# Patient Record
Sex: Female | Born: 1985 | Race: Black or African American | Hispanic: No | Smoking: Current every day smoker
Health system: Southern US, Community
[De-identification: ages and names within clinical notes are randomized; demographics above are authoritative.]

---

## 2014-07-27 ENCOUNTER — Emergency Department (HOSPITAL_COMMUNITY): Payer: BC Managed Care – PPO

## 2014-07-27 ENCOUNTER — Encounter (HOSPITAL_COMMUNITY): Payer: Self-pay | Admitting: *Deleted

## 2014-07-27 ENCOUNTER — Emergency Department (HOSPITAL_COMMUNITY)
Admission: EM | Admit: 2014-07-27 | Discharge: 2014-07-27 | Disposition: A | Discharge status: Home / Self Care | Payer: BC Managed Care – PPO | Attending: Emergency Medicine | Admitting: Emergency Medicine

## 2014-07-27 DIAGNOSIS — Z720 Tobacco use: Secondary | ICD-10-CM | POA: Diagnosis not present

## 2014-07-27 DIAGNOSIS — Z3202 Encounter for pregnancy test, result negative: Secondary | ICD-10-CM | POA: Insufficient documentation

## 2014-07-27 DIAGNOSIS — R519 Headache, unspecified: Secondary | ICD-10-CM

## 2014-07-27 DIAGNOSIS — Z79899 Other long term (current) drug therapy: Secondary | ICD-10-CM | POA: Diagnosis not present

## 2014-07-27 DIAGNOSIS — R51 Headache: Secondary | ICD-10-CM | POA: Diagnosis not present

## 2014-07-27 LAB — URINALYSIS, ROUTINE W REFLEX MICROSCOPIC
Bilirubin Urine: NEGATIVE
Glucose, UA: NEGATIVE mg/dL
Hgb urine dipstick: NEGATIVE
Ketones, ur: NEGATIVE mg/dL
NITRITE: NEGATIVE
Protein, ur: NEGATIVE mg/dL
SPECIFIC GRAVITY, URINE: 1.005 (ref 1.005–1.030)
Urobilinogen, UA: 0.2 mg/dL (ref 0.0–1.0)
pH: 6 (ref 5.0–8.0)

## 2014-07-27 LAB — BASIC METABOLIC PANEL
ANION GAP: 11 (ref 5–15)
BUN: 7 mg/dL (ref 6–23)
CO2: 25 meq/L (ref 19–32)
Calcium: 8.9 mg/dL (ref 8.4–10.5)
Chloride: 105 mEq/L (ref 96–112)
Creatinine, Ser: 0.81 mg/dL (ref 0.50–1.10)
GFR calc Af Amer: >90 mL/min (ref >90)
GFR calc non Af Amer: >90 mL/min (ref >90)
Glucose, Bld: 87 mg/dL (ref 70–99)
POTASSIUM: 4.3 meq/L (ref 3.7–5.3)
SODIUM: 141 meq/L (ref 137–147)

## 2014-07-27 LAB — CBC WITH DIFFERENTIAL/PLATELET
BASOS ABS: 0 10*3/uL (ref 0.0–0.1)
Basophils Relative: 0 % (ref 0–1)
Eosinophils Absolute: 0.1 10*3/uL (ref 0.0–0.7)
Eosinophils Relative: 1 % (ref 0–5)
HCT: 34 % — ABNORMAL LOW (ref 36.0–46.0)
HEMOGLOBIN: 11.2 g/dL — AB (ref 12.0–15.0)
Lymphocytes Relative: 45 % (ref 12–46)
Lymphs Abs: 2.5 10*3/uL (ref 0.7–4.0)
MCH: 28.5 pg (ref 26.0–34.0)
MCHC: 32.9 g/dL (ref 30.0–36.0)
MCV: 86.5 fL (ref 78.0–100.0)
MONO ABS: 0.3 10*3/uL (ref 0.1–1.0)
Monocytes Relative: 4 % (ref 3–12)
Neutro Abs: 2.8 10*3/uL (ref 1.7–7.7)
Neutrophils Relative %: 50 % (ref 43–77)
Platelets: 263 10*3/uL (ref 150–400)
RBC: 3.93 MIL/uL (ref 3.87–5.11)
RDW: 13.6 % (ref 11.5–15.5)
WBC: 5.7 10*3/uL (ref 4.0–10.5)

## 2014-07-27 LAB — URINE MICROSCOPIC-ADD ON

## 2014-07-27 LAB — PREGNANCY, URINE: Preg Test, Ur: NEGATIVE

## 2014-07-27 MED ORDER — SODIUM CHLORIDE 0.9 % IV BOLUS (SEPSIS)
1000.0000 mL | Freq: Once | INTRAVENOUS | Status: AC
  Administered 2014-07-27: 1000 mL via INTRAVENOUS

## 2014-07-27 MED ORDER — KETOROLAC TROMETHAMINE 30 MG/ML IJ SOLN
30.0000 mg | Freq: Once | INTRAMUSCULAR | Status: AC
  Administered 2014-07-27: 30 mg via INTRAVENOUS
  Filled 2014-07-27: qty 1

## 2014-07-27 MED ORDER — METOCLOPRAMIDE HCL 5 MG/ML IJ SOLN
10.0000 mg | Freq: Once | INTRAMUSCULAR | Status: AC
  Administered 2014-07-27: 10 mg via INTRAVENOUS
  Filled 2014-07-27: qty 2

## 2014-07-27 NOTE — ED Notes (Signed)
The pt is c/o a headache for one week and seeing spots  No nv or diarrhea.  lmp   This month nov 21st

## 2014-07-27 NOTE — ED Provider Notes (Signed)
CSN: [id number]     Arrival date & time 07/27/14  1729 History   First MD Initiated Contact with Patient 07/27/14 1825     Chief Complaint  Patient presents with  . Headache     (Consider location/radiation/quality/duration/timing/severity/associated sxs/prior Treatment) HPI   The patient is brought in by her mom for evaluation of headaches.  Meagan Thomas has developed headaches 1 year ago that have been progressively getting worse over the past year. Each headache is same in quality, consistency, and has been worsening in severity. Previous to the past year she never had headaches and if she did they were very mild and did not last long.  Her headaches are bifrontal and occipital. They are sharp and have pressure-like quality which she describes as behind her eyes. She has had a eye exam done and was told her vision is normal and that her headaches are not coming from her eyes. The mom has become concerned because over the past year she has had multiple transient and vague complaints. The patient did recently get divorced 1 year ago when the symptoms started. The mom is also concerned because one of [employer] colleagues was diagnosed with meningitis. She denies having any symptoms of feeling sick, neck pain, fevers, coughing, nausea, vomiting, diarrhea, abnormal periods.  History reviewed. No pertinent past medical history. History reviewed. No pertinent past surgical history. No family history on file. History  Substance Use Topics  . Smoking status: Current Every Day Smoker  . Smokeless tobacco: Not on file  . Alcohol Use: No   OB History    No data available     Review of Systems  10 Systems reviewed and are negative for acute change except as noted in the HPI.     Allergies  Peanut-containing drug products  Home Medications   Prior to Admission medications   Medication Sig Start Date End Date Taking? Authorizing Provider  pseudoephedrine (SUDAFED) 30 MG tablet Take 30 mg by  mouth every 4 (four) hours as needed for congestion.   Yes Historical Provider, MD   BP 123/80 mmHg  Pulse 82  Temp(Src) 98.5 F (36.9 C)  Resp 16  Ht 5' (1.524 m)  Wt 98 lb (44.453 kg)  BMI 19.14 kg/m2  SpO2 100%  LMP 07/21/2014 Physical Exam  Constitutional: She appears well-developed and well-nourished. No distress.  HENT:  Head: Normocephalic and atraumatic.  Right Ear: Tympanic membrane and ear canal normal.  Left Ear: Tympanic membrane and ear canal normal.  Nose: Nose normal.  Eyes: Pupils are equal, round, and reactive to light.  Neck: Normal range of motion. Neck supple. No spinous process tenderness and no muscular tenderness present. Normal range of motion present.  Cardiovascular: Normal rate and regular rhythm.   Pulmonary/Chest: Effort normal.  Abdominal: Soft. Bowel sounds are normal. She exhibits no distension and no fluid wave. There is no tenderness. There is no rigidity, no rebound, no guarding and no CVA tenderness.  Neurological: She is alert.  Cranial nerves II-VIII and X-XII evaluated and show no deficits. Pt alert and oriented x 3 Upper and lower extremity strength is symmetrical and physiologic Normal muscular tone No facial droop Coordination intact, no limb ataxia, finger-nose-finger normal Rapid alternating movements normal No pronator drift  Skin: Skin is warm and dry.  Nursing note and vitals reviewed.   ED Course  Procedures (including critical care time) Labs Review Labs Reviewed  CBC WITH DIFFERENTIAL  BASIC METABOLIC PANEL  URINALYSIS, ROUTINE W REFLEX MICROSCOPIC  PREGNANCY,  URINE    Imaging Review No results found.   EKG Interpretation None      MDM   Final diagnoses:  Headache    Medications  sodium chloride 0.9 % bolus 1,000 mL (1,000 mLs Intravenous New Bag/Given 07/27/14 1948)  metoCLOPramide (REGLAN) injection 10 mg (10 mg Intravenous Given 07/27/14 1948)  ketorolac (TORADOL) 30 MG/ML injection 30 mg (30 mg  Intravenous Given 07/27/14 1948)    The patients pain resolved from an 8/10 to 0/10 with the fluids and Toradol with Reglan. Her labs and head CT are unremarkable.  The patient denies any symptoms of neurological impairment or TIA's; no amaurosis, diplopia, dysphasia, or unilateral disturbance of motor or sensory function. No loss of balance or vertigo.  I recommend she follow-up with Neurology and start keeping a headache journal documenting everything from how much  stress, water, food,mood,  sleep and physical activity she has had.   [name] evaluation in the Emergency Department is complete. It has been determined that no acute conditions requiring further emergency intervention are present at this time. The patient/guardian have been advised of the diagnosis and plan. We have discussed signs and symptoms that warrant return to the ED, such as changes or worsening in symptoms.  Vital signs are stable at discharge. Filed Vitals:   07/27/14 2149  BP:   Pulse:   Temp: 98.6 F (37 C)  Resp:     Patient/guardian has voiced understanding and agreed to follow-up with the PCP or specialist.    Dorthula Matasiffany G Raford Brissett, PA-C 07/27/14 2232  Mirian MoMatthew Gentry, MD 07/31/14 [phone]

## 2014-07-27 NOTE — Discharge Instructions (Signed)
Headaches, Frequently Asked Questions °MIGRAINE HEADACHES °Q: What is migraine? What causes it? How can I treat it? °A: Generally, migraine headaches begin as a dull ache. Then they develop into a constant, throbbing, and pulsating pain. You may experience pain at the temples. You may experience pain at the front or back of one or both sides of the head. The pain is usually accompanied by a combination of: °· Nausea. °· Vomiting. °· Sensitivity to light and noise. °Some people (about 15%) experience an aura (see below) before an attack. The cause of migraine is believed to be chemical reactions in the brain. Treatment for migraine may include over-the-counter or prescription medications. It may also include self-help techniques. These include relaxation training and biofeedback.  °Q: What is an aura? °A: About 15% of people with migraine get an "aura". This is a sign of neurological symptoms that occur before a migraine headache. You may see wavy or jagged lines, dots, or flashing lights. You might experience tunnel vision or blind spots in one or both eyes. The aura can include visual or auditory hallucinations (something imagined). It may include disruptions in smell (such as strange odors), taste or touch. Other symptoms include: °· Numbness. °· A "pins and needles" sensation. °· Difficulty in recalling or speaking the correct word. °These neurological events may last as long as 60 minutes. These symptoms will fade as the headache begins. °Q: What is a trigger? °A: Certain physical or environmental factors can lead to or "trigger" a migraine. These include: °· Foods. °· Hormonal changes. °· Weather. °· Stress. °It is important to remember that triggers are different for everyone. To help prevent migraine attacks, you need to figure out which triggers affect you. Keep a headache diary. This is a good way to track triggers. The diary will help you talk to your healthcare professional about your condition. °Q: Does  weather affect migraines? °A: Bright sunshine, hot, humid conditions, and drastic changes in barometric pressure may lead to, or "trigger," a migraine attack in some people. But studies have shown that weather does not act as a trigger for everyone with migraines. °Q: What is the link between migraine and hormones? °A: Hormones start and regulate many of your body's functions. Hormones keep your body in balance within a constantly changing environment. The levels of hormones in your body are unbalanced at times. Examples are during menstruation, pregnancy, or menopause. That can lead to a migraine attack. In fact, about three quarters of all women with migraine report that their attacks are related to the menstrual cycle.  °Q: Is there an increased risk of stroke for migraine sufferers? °A: The likelihood of a migraine attack causing a stroke is very remote. That is not to say that migraine sufferers cannot have a stroke associated with their migraines. In persons under age 40, the most common associated factor for stroke is migraine headache. But over the course of a person's normal life span, the occurrence of migraine headache may actually be associated with a reduced risk of dying from cerebrovascular disease due to stroke.  °Q: What are acute medications for migraine? °A: Acute medications are used to treat the pain of the headache after it has started. Examples over-the-counter medications, NSAIDs, ergots, and triptans.  °Q: What are the triptans? °A: Triptans are the newest class of abortive medications. They are specifically targeted to treat migraine. Triptans are vasoconstrictors. They moderate some chemical reactions in the brain. The triptans work on receptors in your brain. Triptans help   to restore the balance of a neurotransmitter called serotonin. Fluctuations in levels of serotonin are thought to be a main cause of migraine.  °Q: Are over-the-counter medications for migraine effective? °A:  Over-the-counter, or "OTC," medications may be effective in relieving mild to moderate pain and associated symptoms of migraine. But you should see your caregiver before beginning any treatment regimen for migraine.  °Q: What are preventive medications for migraine? °A: Preventive medications for migraine are sometimes referred to as "prophylactic" treatments. They are used to reduce the frequency, severity, and length of migraine attacks. Examples of preventive medications include antiepileptic medications, antidepressants, beta-blockers, calcium channel blockers, and NSAIDs (nonsteroidal anti-inflammatory drugs). °Q: Why are anticonvulsants used to treat migraine? °A: During the past few years, there has been an increased interest in antiepileptic drugs for the prevention of migraine. They are sometimes referred to as "anticonvulsants". Both epilepsy and migraine may be caused by similar reactions in the brain.  °Q: Why are antidepressants used to treat migraine? °A: Antidepressants are typically used to treat people with depression. They may reduce migraine frequency by regulating chemical levels, such as serotonin, in the brain.  °Q: What alternative therapies are used to treat migraine? °A: The term "alternative therapies" is often used to describe treatments considered outside the scope of conventional Western medicine. Examples of alternative therapy include acupuncture, acupressure, and yoga. Another common alternative treatment is herbal therapy. Some herbs are believed to relieve headache pain. Always discuss alternative therapies with your caregiver before proceeding. Some herbal products contain arsenic and other toxins. °TENSION HEADACHES °Q: What is a tension-type headache? What causes it? How can I treat it? °A: Tension-type headaches occur randomly. They are often the result of temporary stress, anxiety, fatigue, or anger. Symptoms include soreness in your temples, a tightening band-like sensation  around your head (a "vice-like" ache). Symptoms can also include a pulling feeling, pressure sensations, and contracting head and neck muscles. The headache begins in your forehead, temples, or the back of your head and neck. Treatment for tension-type headache may include over-the-counter or prescription medications. Treatment may also include self-help techniques such as relaxation training and biofeedback. °CLUSTER HEADACHES °Q: What is a cluster headache? What causes it? How can I treat it? °A: Cluster headache gets its name because the attacks come in groups. The pain arrives with little, if any, warning. It is usually on one side of the head. A tearing or bloodshot eye and a runny nose on the same side of the headache may also accompany the pain. Cluster headaches are believed to be caused by chemical reactions in the brain. They have been described as the most severe and intense of any headache type. Treatment for cluster headache includes prescription medication and oxygen. °SINUS HEADACHES °Q: What is a sinus headache? What causes it? How can I treat it? °A: When a cavity in the bones of the face and skull (a sinus) becomes inflamed, the inflammation will cause localized pain. This condition is usually the result of an allergic reaction, a tumor, or an infection. If your headache is caused by a sinus blockage, such as an infection, you will probably have a fever. An x-ray will confirm a sinus blockage. Your caregiver's treatment might include antibiotics for the infection, as well as antihistamines or decongestants.  °REBOUND HEADACHES °Q: What is a rebound headache? What causes it? How can I treat it? °A: A pattern of taking acute headache medications too often can lead to a condition known as "rebound headache."   A pattern of taking too much headache medication includes taking it more than 2 days per week or in excessive amounts. That means more than the label or a caregiver advises. With rebound  headaches, your medications not only stop relieving pain, they actually begin to cause headaches. Doctors treat rebound headache by tapering the medication that is being overused. Sometimes your caregiver will gradually substitute a different type of treatment or medication. Stopping may be a challenge. Regularly overusing a medication increases the potential for serious side effects. Consult a caregiver if you regularly use headache medications more than 2 days per week or more than the label advises. °ADDITIONAL QUESTIONS AND ANSWERS °Q: What is biofeedback? °A: Biofeedback is a self-help treatment. Biofeedback uses special equipment to monitor your body's involuntary physical responses. Biofeedback monitors: °· Breathing. °· Pulse. °· Heart rate. °· Temperature. °· Muscle tension. °· Brain activity. °Biofeedback helps you refine and perfect your relaxation exercises. You learn to control the physical responses that are related to stress. Once the technique has been mastered, you do not need the equipment any more. °Q: Are headaches hereditary? °A: Four out of five (80%) of people that suffer report a family history of migraine. Scientists are not sure if this is genetic or a family predisposition. Despite the uncertainty, a child has a 50% chance of having migraine if one parent suffers. The child has a 75% chance if both parents suffer.  °Q: Can children get headaches? °A: By the time they reach high school, most young people have experienced some type of headache. Many safe and effective approaches or medications can prevent a headache from occurring or stop it after it has begun.  °Q: What type of doctor should I see to diagnose and treat my headache? °A: Start with your primary caregiver. Discuss his or her experience and approach to headaches. Discuss methods of classification, diagnosis, and treatment. Your caregiver may decide to recommend you to a headache specialist, depending upon your symptoms or other  physical conditions. Having diabetes, allergies, etc., may require a more comprehensive and inclusive approach to your headache. The National Head[employer] will provide, upon request, a list of NHF physician members in your state. °Document Released: 11/07/2003 Document Revised: 11/09/2011 Document Reviewed: 04/16/2008 °ExitCare® Patient Information ©2015 [employer]. This information is not intended to replace advice given to you by your health care provider. Make sure you discuss any questions you have with your health care provider. ° °General Headache Without Cause °A headache is pain or discomfort felt around the head or neck area. The specific cause of a headache may not be found. There are many causes and types of headaches. A few common ones are: °· Tension headaches. °· Migraine headaches. °· Cluster headaches. °· Chronic daily headaches. °HOME CARE INSTRUCTIONS  °· Keep all follow-up appointments with your caregiver or any specialist referral. °· Only take over-the-counter or prescription medicines for pain or discomfort as directed by your caregiver. °· Lie down in a dark, quiet room when you have a headache. °· Keep a headache journal to find out what may trigger your migraine headaches. For example, write down: °¨ What you eat and drink. °¨ How much sleep you get. °¨ Any change to your diet or medicines. °· Try massage or other relaxation techniques. °· Put ice packs or heat on the head and neck. Use these 3 to 4 times per day for 15 to 20 minutes each time, or as needed. °· Limit stress. °· Sit up   straight, and do not tense your muscles. °· Quit smoking if you smoke. °· Limit alcohol use. °· Decrease the amount of caffeine you drink, or stop drinking caffeine. °· Eat and sleep on a regular schedule. °· Get 7 to 9 hours of sleep, or as recommended by your caregiver. °· Keep lights dim if bright lights bother you and make your headaches worse. °SEEK MEDICAL CARE IF:  °· You have problems with the  medicines you were prescribed. °· Your medicines are not working. °· You have a change from the usual headache. °· You have nausea or vomiting. °SEEK IMMEDIATE MEDICAL CARE IF:  °· Your headache becomes severe. °· You have a fever. °· You have a stiff neck. °· You have loss of vision. °· You have muscular weakness or loss of muscle control. °· You start losing your balance or have trouble walking. °· You feel faint or pass out. °· You have severe symptoms that are different from your first symptoms. °MAKE SURE YOU:  °· Understand these instructions. °· Will watch your condition. °· Will get help right away if you are not doing well or get worse. °Document Released: 08/17/2005 Document Revised: 11/09/2011 Document Reviewed: 09/02/2011 °ExitCare® Patient Information ©2015 [employer]. This information is not intended to replace advice given to you by your health care provider. Make sure you discuss any questions you have with your health care provider. ° °

## 2014-07-30 LAB — URINE CULTURE: Colony Count: >=100000

## 2014-07-31 ENCOUNTER — Telehealth (HOSPITAL_BASED_OUTPATIENT_CLINIC_OR_DEPARTMENT_OTHER): Payer: Self-pay | Admitting: Emergency Medicine

## 2014-07-31 NOTE — Telephone Encounter (Signed)
Post ED Visit - Positive Culture Follow-up: Successful Patient Follow-Up  Culture assessed and recommendations reviewed by: []  Wes Dulaney, Pharm.D., BCPS [x]  Celedonio MiyamotoJeremy Frens, Pharm.D., BCPS []  Georgina PillionElizabeth Martin, Pharm.D., BCPS []  StacyvilleMinh Pham, [address] BoulevardPharm.D., BCPS, AAHIVP []  Estella HuskMichelle Turner, Pharm.D., BCPS, AAHIVP []  Red ChristiansSamson Lee, Pharm.D. []  Tennis Mustassie Stewart, Pharm.D.  Positive urine culture E. Coli  [x]  Patient discharged without antimicrobial prescription and treatment is now indicated []  Organism is resistant to prescribed ED discharge antimicrobial []  Patient with positive blood cultures  Changes discussed with ED provider: Emilia BeckKaitlyn Szekalski PA  New antibiotic prescription Metronidazole 500mg  po bid x 7 days   Attempt to call pt and notify, lvm for callback    Berle MullMiller, Alanya Vukelich 07/31/2014, 11:56 AM

## 2014-07-31 NOTE — Progress Notes (Signed)
ED Antimicrobial Stewardship Positive Culture Follow Up   Meagan Thomas is an 28 y.o. female who [facility] with a chief complaint of  Chief Complaint  Patient presents with  . Headache    Recent Results (from the past 720 hour(s))  Urine culture     Status: None   Collection Time: 07/27/14  8:42 PM  Result Value Ref Range Status   Specimen Description URINE, CLEAN CATCH  Final   Special Requests ADDED [id number]  Final   Culture  Setup Time   Final    07/28/2014 02:48 Performed at [facility]    Colony Count   Final    >=100,000 COLONIES/ML Performed at [facility]    Culture   Final    ESCHERICHIA COLI Performed at [facility]    Report Status 07/30/2014 FINAL  Final   Organism ID, Bacteria ESCHERICHIA COLI  Final      Susceptibility   Escherichia coli - MIC*    AMPICILLIN 4 SENSITIVE Sensitive     CEFAZOLIN <=4 SENSITIVE Sensitive     CEFTRIAXONE <=1 SENSITIVE Sensitive     CIPROFLOXACIN >=4 RESISTANT Resistant     GENTAMICIN <=1 SENSITIVE Sensitive     LEVOFLOXACIN >=8 RESISTANT Resistant     NITROFURANTOIN <=16 SENSITIVE Sensitive     TOBRAMYCIN <=1 SENSITIVE Sensitive     TRIMETH/SULFA <=20 SENSITIVE Sensitive     PIP/TAZO <=4 SENSITIVE Sensitive     * ESCHERICHIA COLI     [x]  Patient discharged originally without antimicrobial agent and treatment is now indicated Urine culture is contaminated with squamous cells.  UA positive for trichomonas.  New antibiotic prescription: metronidazole 500 po BID x 7 days  ED Provider: Emilia BeckKaitlyn Szekalski PA-C   Mickeal SkinnerFrens, Jaquala Fuller John 07/31/2014, 9:28 AM Infectious Diseases Pharmacist Phone# [phone]

## 2014-08-03 ENCOUNTER — Telehealth: Payer: Self-pay | Admitting: *Deleted

## 2015-07-01 IMAGING — CT CT HEAD W/O CM
2 series · 16 of 30 positions shown, 20 images · non-contrast
Comparison: None.

CLINICAL DATA: Headache

EXAM:
CT HEAD WITHOUT CONTRAST
TECHNIQUE: Contiguous axial images were obtained from the base of the skull
through the vertex without intravenous contrast.

[Series 201: head w/o, idose (1) · axial · non-contrast · 0.49mm/px · z∈[+81,+201]mm · 13 of 30 slices shown, 17 images]
[im 3/30  brain]
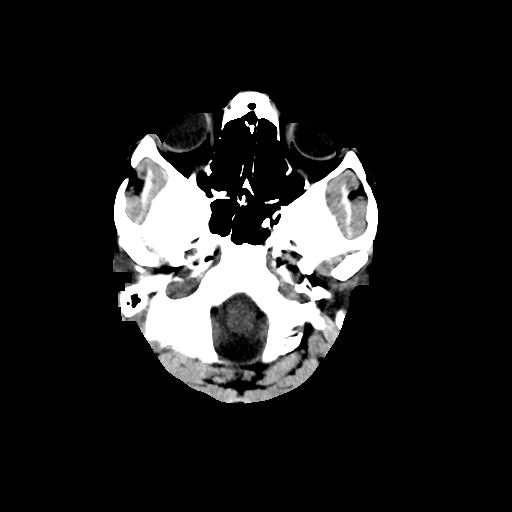
[im 3/30  bone]
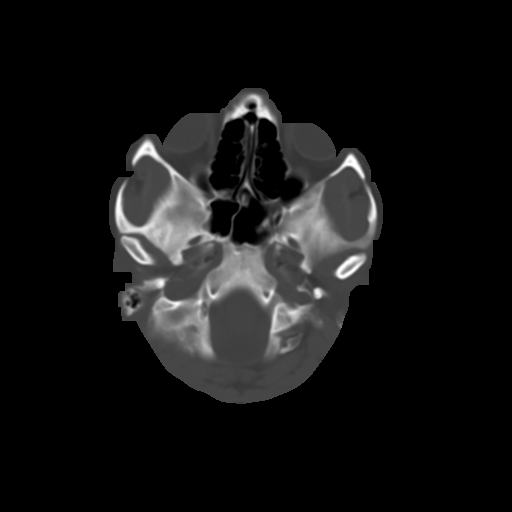
[im 5/30  brain]
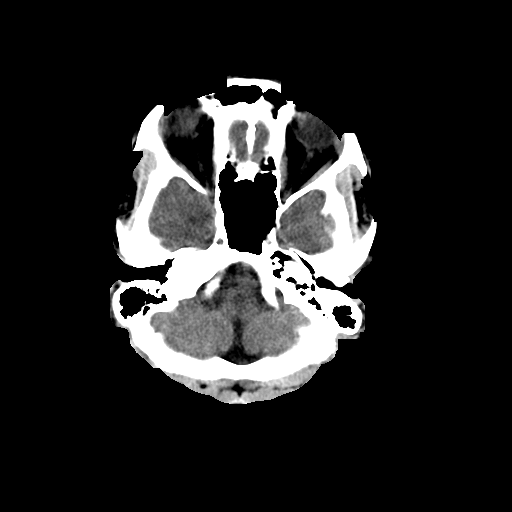
[im 7/30  brain]
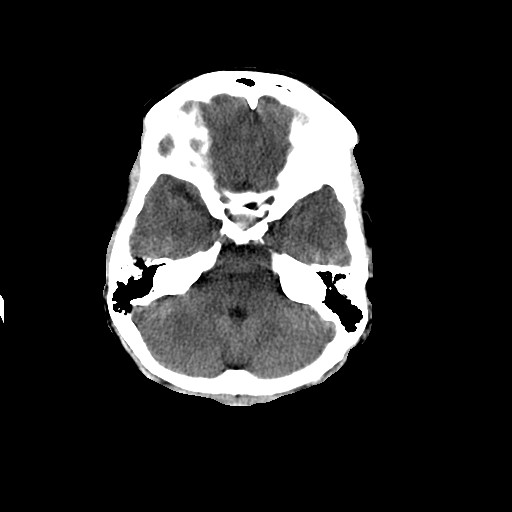
[im 9/30  brain]
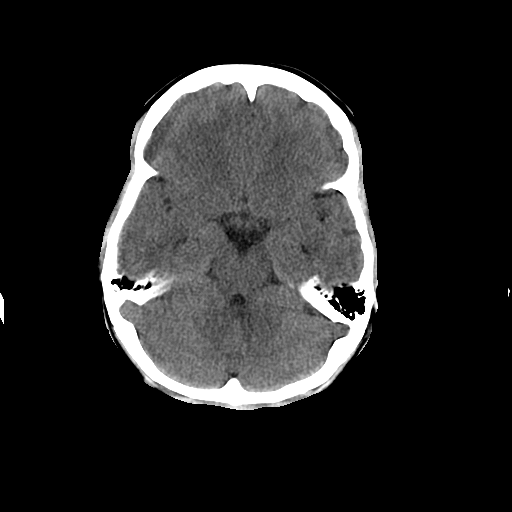
[im 11/30  brain]
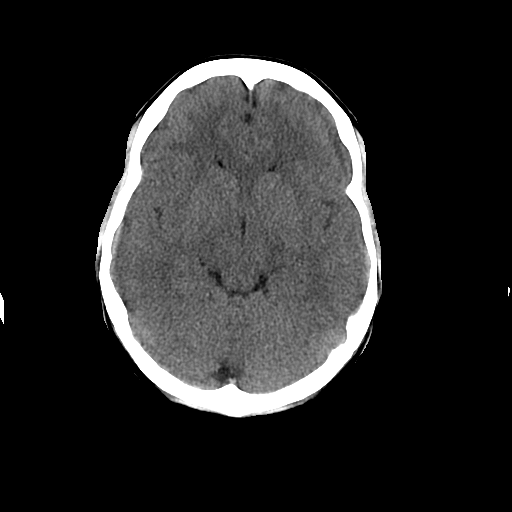
[im 11/30  bone]
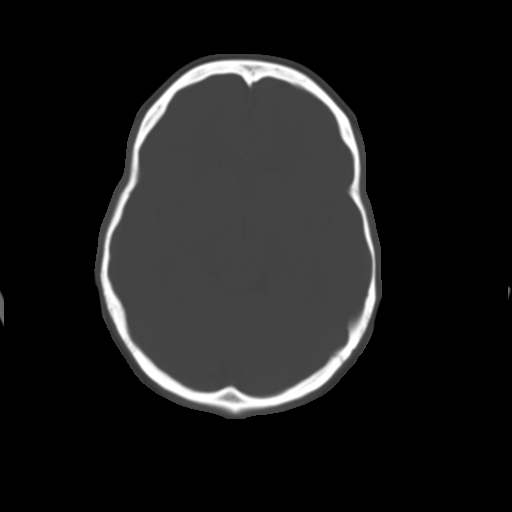
[im 13/30  brain]
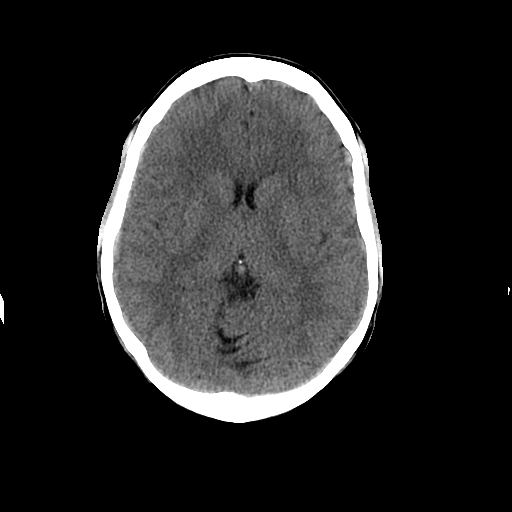
[im 15/30  brain]
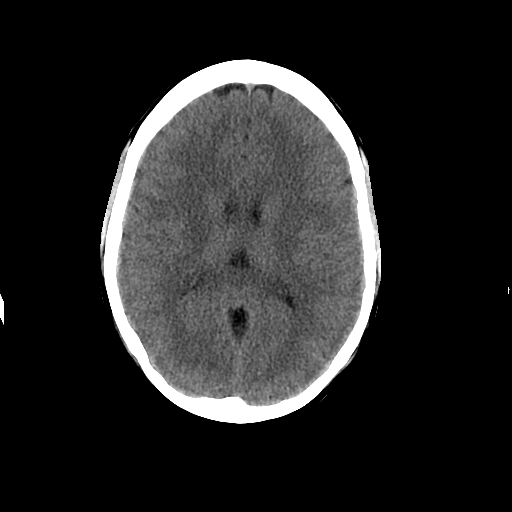
[im 17/30  brain]
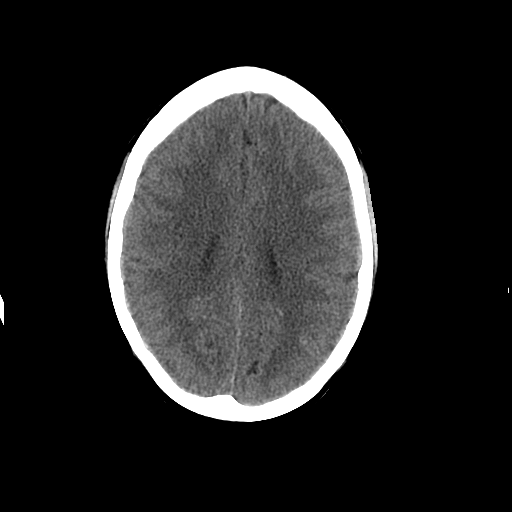
[im 19/30  brain]
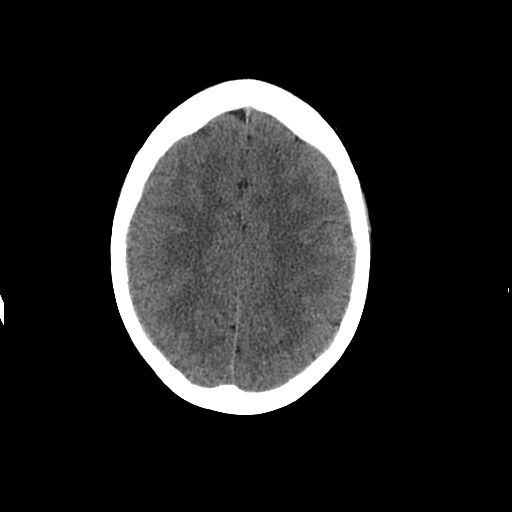
[im 19/30  bone]
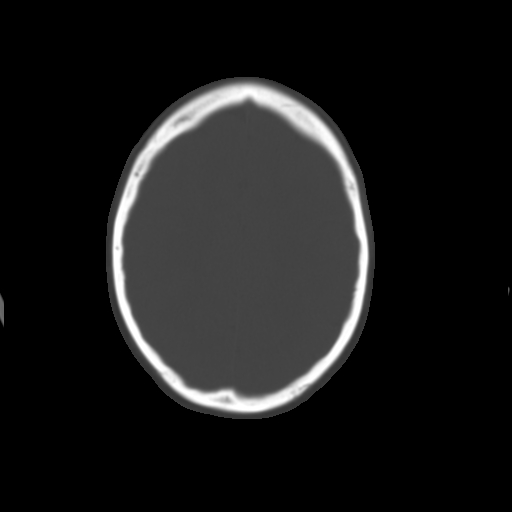
[im 21/30  brain]
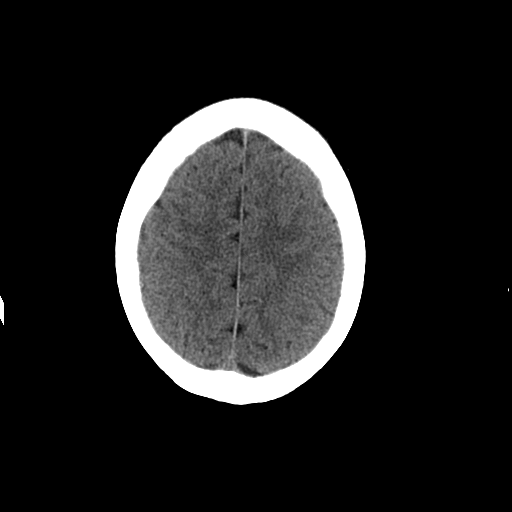
[im 23/30  brain]
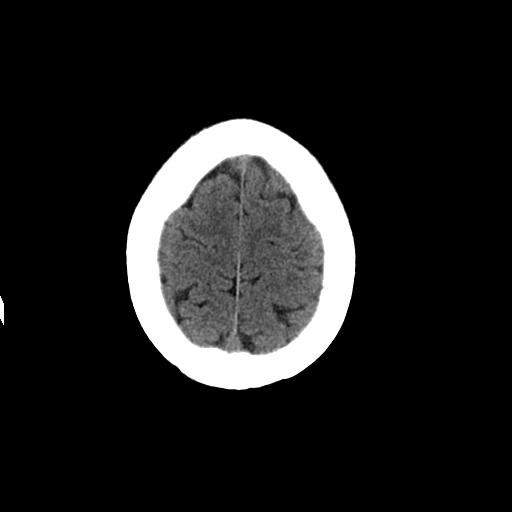
[im 25/30  brain]
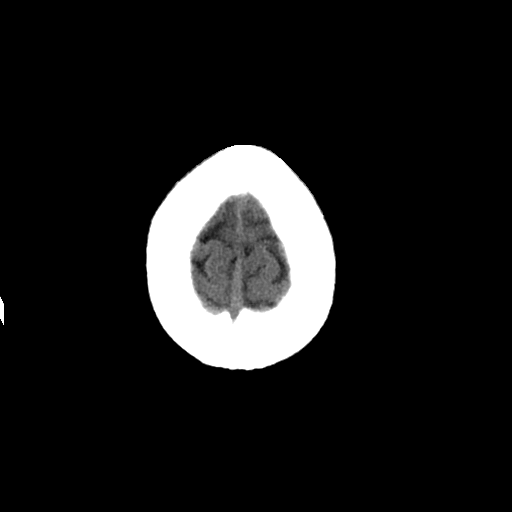
[im 27/30  brain]
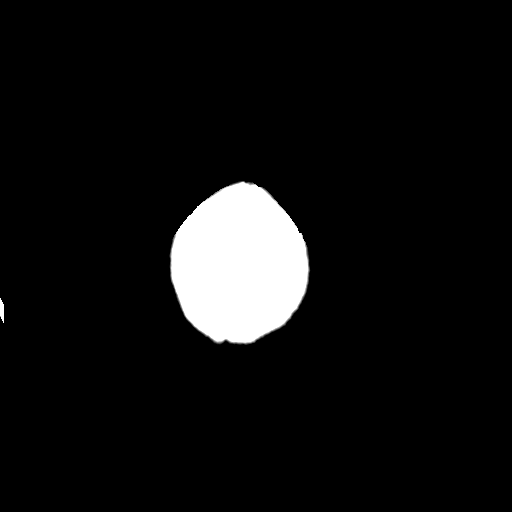
[im 27/30  bone]
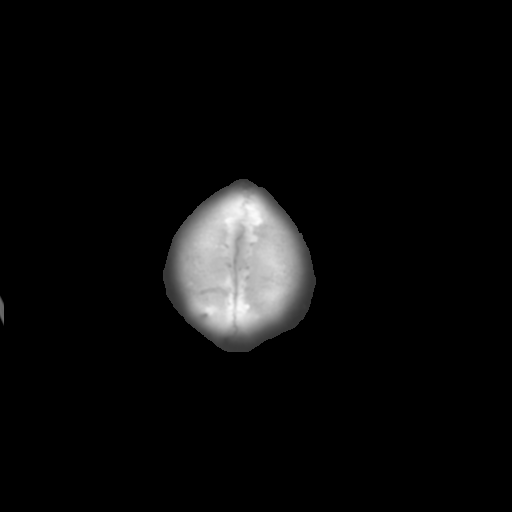

[Series 202: head w/o bone, idose (1) · axial · non-contrast · 0.49mm/px · z∈[+81,+121]mm · 3 of 30 slices shown]
[im 3/30  bone]
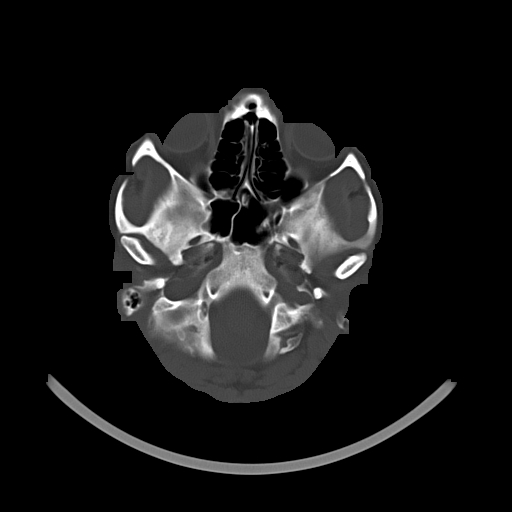
[im 7/30  bone]
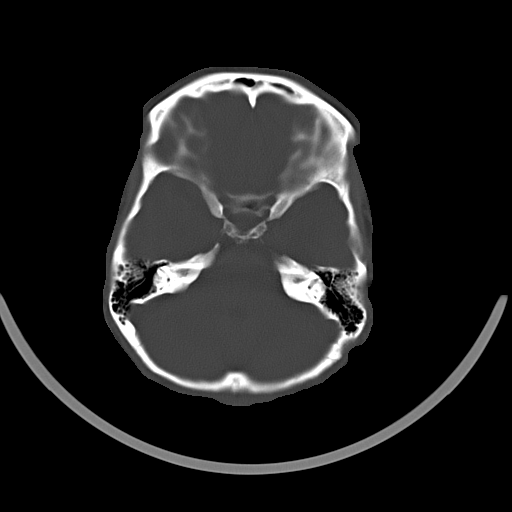
[im 11/30  bone]
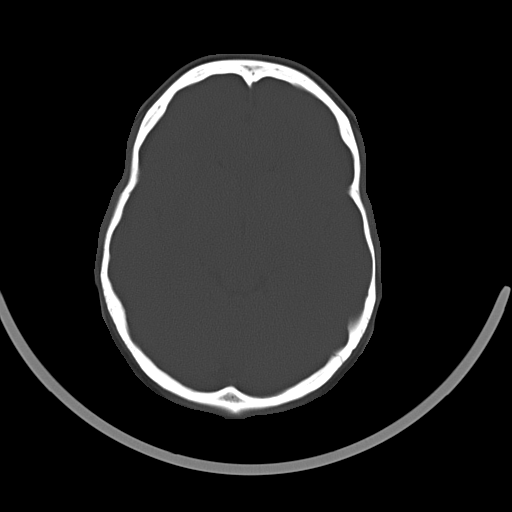

[16 of 30 positions shown; findings below may reference images not displayed]

FINDINGS: Ventricle size is normal. Negative for acute or chronic infarction.
Negative for hemorrhage or fluid collection. Negative for mass or
edema. No shift of the midline structures.

Calvarium is intact.
IMPRESSION: Negative
